# Patient Record
Sex: Male | Born: 1955 | Race: Black or African American | Hispanic: No | Marital: Married
Health system: Southern US, Community
[De-identification: ages and names within clinical notes are randomized; demographics above are authoritative.]

---

## 2021-06-17 ENCOUNTER — Emergency Department
Admission: EM | Admit: 2021-06-17 | Discharge: 2021-06-17 | Disposition: A | Discharge status: Home / Self Care | Payer: BC Managed Care – PPO | Attending: Emergency Medicine | Admitting: Emergency Medicine

## 2021-06-17 ENCOUNTER — Other Ambulatory Visit: Payer: Self-pay

## 2021-06-17 ENCOUNTER — Emergency Department: Payer: BC Managed Care – PPO

## 2021-06-17 DIAGNOSIS — R1012 Left upper quadrant pain: Secondary | ICD-10-CM | POA: Diagnosis present

## 2021-06-17 DIAGNOSIS — R109 Unspecified abdominal pain: Secondary | ICD-10-CM

## 2021-06-17 LAB — URINALYSIS, COMPLETE (UACMP) WITH MICROSCOPIC
Bacteria, UA: NONE SEEN
Bilirubin Urine: NEGATIVE
Glucose, UA: 50 mg/dL — AB
Hgb urine dipstick: NEGATIVE
Ketones, ur: NEGATIVE mg/dL
Leukocytes,Ua: NEGATIVE
Nitrite: NEGATIVE
Protein, ur: 30 mg/dL — AB
Specific Gravity, Urine: 1.021 (ref 1.005–1.030)
pH: 5 (ref 5.0–8.0)

## 2021-06-17 LAB — COMPREHENSIVE METABOLIC PANEL
ALT: 36 U/L (ref 0–44)
AST: 40 U/L (ref 15–41)
Albumin: 3.9 g/dL (ref 3.5–5.0)
Alkaline Phosphatase: 72 U/L (ref 38–126)
Anion gap: 12 (ref 5–15)
BUN: 12 mg/dL (ref 8–23)
CO2: 26 mmol/L (ref 22–32)
Calcium: 9.1 mg/dL (ref 8.9–10.3)
Chloride: 101 mmol/L (ref 98–111)
Creatinine, Ser: 1.04 mg/dL (ref 0.61–1.24)
GFR, Estimated: >60 mL/min (ref >60)
Glucose, Bld: 118 mg/dL — ABNORMAL HIGH (ref 70–99)
Potassium: 3.3 mmol/L — ABNORMAL LOW (ref 3.5–5.1)
Sodium: 139 mmol/L (ref 135–145)
Total Bilirubin: 1 mg/dL (ref 0.3–1.2)
Total Protein: 7.9 g/dL (ref 6.5–8.1)

## 2021-06-17 LAB — CBC
HCT: 38.4 % — ABNORMAL LOW (ref 39.0–52.0)
Hemoglobin: 13.2 g/dL (ref 13.0–17.0)
MCH: 28.9 pg (ref 26.0–34.0)
MCHC: 34.4 g/dL (ref 30.0–36.0)
MCV: 84.2 fL (ref 80.0–100.0)
Platelets: 233 10*3/uL (ref 150–400)
RBC: 4.56 MIL/uL (ref 4.22–5.81)
RDW: 13.7 % (ref 11.5–15.5)
WBC: 8 10*3/uL (ref 4.0–10.5)
nRBC: 0 % (ref 0.0–0.2)

## 2021-06-17 LAB — LIPASE, BLOOD: Lipase: 24 U/L (ref 11–51)

## 2021-06-17 MED ORDER — IOHEXOL 350 MG/ML SOLN
75.0000 mL | Freq: Once | INTRAVENOUS | Status: AC | PRN
  Administered 2021-06-17: 75 mL via INTRAVENOUS

## 2021-06-17 MED ORDER — IBUPROFEN 400 MG PO TABS
400.0000 mg | ORAL_TABLET | Freq: Once | ORAL | Status: AC
  Administered 2021-06-17: 400 mg via ORAL
  Filled 2021-06-17: qty 1

## 2021-06-17 NOTE — ED Provider Notes (Signed)
[facility]  ____________________________________________   Event Date/Time   First MD Initiated Contact with Patient 06/17/21 1018     (approximate)  I have reviewed the triage vital signs and the nursing notes.   HISTORY  Chief Complaint LUQ pain    HPI Vann Okerlund is a 65 y.o. male no pmh p/w L sided flank pain.  Symptom onset was 3 days ago.  Pain is located in the left flank radiating around to the left upper back.  Denies associated shortness of breath or chest pain.  No nausea or vomiting.  Denies urinary symptoms including dysuria or hematuria.  Has never had this before.  Denies any preceding injury or activity that could have exacerbated it.      History reviewed. No pertinent past medical history.  There are no problems to display for this patient.   History reviewed. No pertinent surgical history.  Prior to Admission medications   Not on File    Allergies Patient has no allergy information on record.  No family history on file.  Social History    Review of Systems   Review of Systems  Constitutional:  Negative for chills and fever.  Respiratory:  Negative for shortness of breath.   Cardiovascular:  Negative for chest pain.  Gastrointestinal:  Negative for abdominal pain, nausea and vomiting.  Genitourinary:  Negative for difficulty urinating, dysuria and hematuria.  Neurological:  Negative for numbness.  All other systems reviewed and are negative.  Physical Exam Updated Vital Signs BP (!) 165/85   Pulse 74   Temp 98 F (36.7 C)   Resp 18   SpO2 100%   Physical Exam Vitals and nursing note reviewed.  Constitutional:      General: He is not in acute distress.    Appearance: Normal appearance.  HENT:     Head: Normocephalic and atraumatic.  Eyes:     General: No scleral icterus.    Conjunctiva/sclera: Conjunctivae normal.  Pulmonary:     Effort: Pulmonary effort is normal. No respiratory distress.      Breath sounds: Normal breath sounds. No wheezing.  Abdominal:     Palpations: Abdomen is soft.     Tenderness: There is no abdominal tenderness. There is left CVA tenderness.  Musculoskeletal:        General: No deformity or signs of injury.     Cervical back: Normal range of motion.  Skin:    Coloration: Skin is not jaundiced or pale.  Neurological:     General: No focal deficit present.     Mental Status: He is alert and oriented to person, place, and time. Mental status is at baseline.  Psychiatric:        Mood and Affect: Mood normal.        Behavior: Behavior normal.     LABS (all labs ordered are listed, but only abnormal results are displayed)  Labs Reviewed  COMPREHENSIVE METABOLIC PANEL - Abnormal; Notable for the following components:      Result Value   Potassium 3.3 (*)    Glucose, Bld 118 (*)    All other components within normal limits  CBC - Abnormal; Notable for the following components:   HCT 38.4 (*)    All other components within normal limits  URINALYSIS, COMPLETE (UACMP) WITH MICROSCOPIC - Abnormal; Notable for the following components:   Color, Urine YELLOW (*)    APPearance CLEAR (*)    Glucose, UA 50 (*)    Protein,  ur 30 (*)    All other components within normal limits  LIPASE, BLOOD   ____________________________________________  EKG  N/a ____________________________________________  RADIOLOGY I, Randol Kern, personally viewed and evaluated these images (plain radiographs) as part of my medical decision making, as well as reviewing the written report by the radiologist.  ED MD interpretation: CT abdomen pelvis does not show any acute abdominal pathology    ____________________________________________   PROCEDURES  Procedure(s) performed (including Critical Care):  Procedures   ____________________________________________   INITIAL IMPRESSION / ASSESSMENT AND PLAN / ED COURSE     Patient is a 65 year old male who  presents with left-sided flank pain for 3 days.  He has no abdominal tenderness on exam but does have some CVA tenderness without associated symptoms.  Obtained a chest x-ray to evaluate for pneumonia or other pulmonary etiology of the pain which is negative.  Labs are reassuring.  UA without evidence of infection or hematuria.  I considered pulmonary embolism however he has no tachycardia and is satting 100% without any shortness of breath or chest pain I feel this is less likely.  Given this is new pain for him obtained a CT abdomen pelvis with contrast which is essentially normal.  Suspect that this is just musculoskeletal in nature.  Advised the patient take NSAIDs and follow-up with his primary care provider.      ____________________________________________   FINAL CLINICAL IMPRESSION(S) / ED DIAGNOSES  Final diagnoses:  Flank pain     ED Discharge Orders     None        Note:  This document was prepared using Dragon voice recognition software and may include unintentional dictation errors.    Georga Hacking, MD 06/17/21 [phone]

## 2021-06-17 NOTE — ED Triage Notes (Signed)
Pt comes with c/o LUQ pain for few days. Pt denies any fall or injuries. Pt states he just couldn't sleep last night it was hurting so bad.

## 2022-05-31 IMAGING — CR DG CHEST 2V
1 series · 2 of 2 positions shown · non-contrast
Comparison: None.

CLINICAL DATA: 64-year-old male with left upper quadrant pain. No
known injury.

EXAM:
CHEST - 2 VIEW

[Series 1: view not recorded · 0.14mm/px · 2 of 2 slices shown]
[im 1/2]
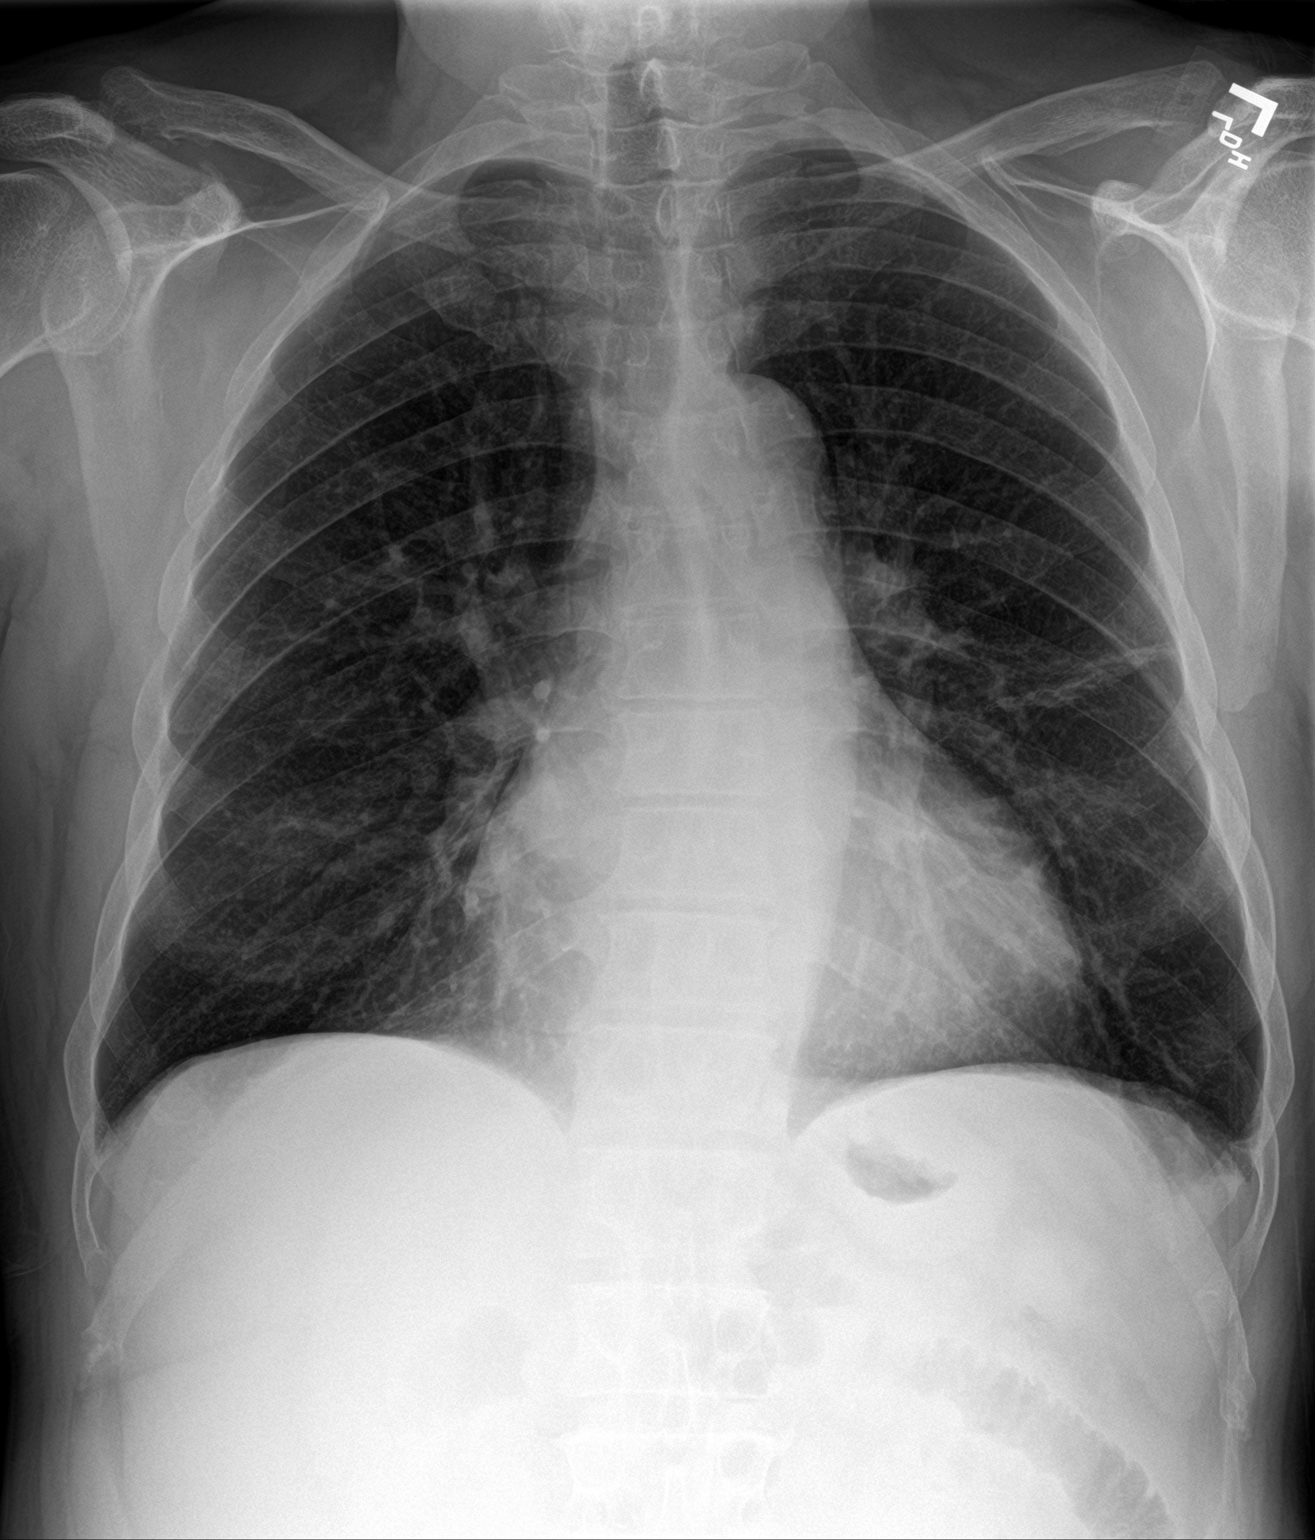
[im 2/2]
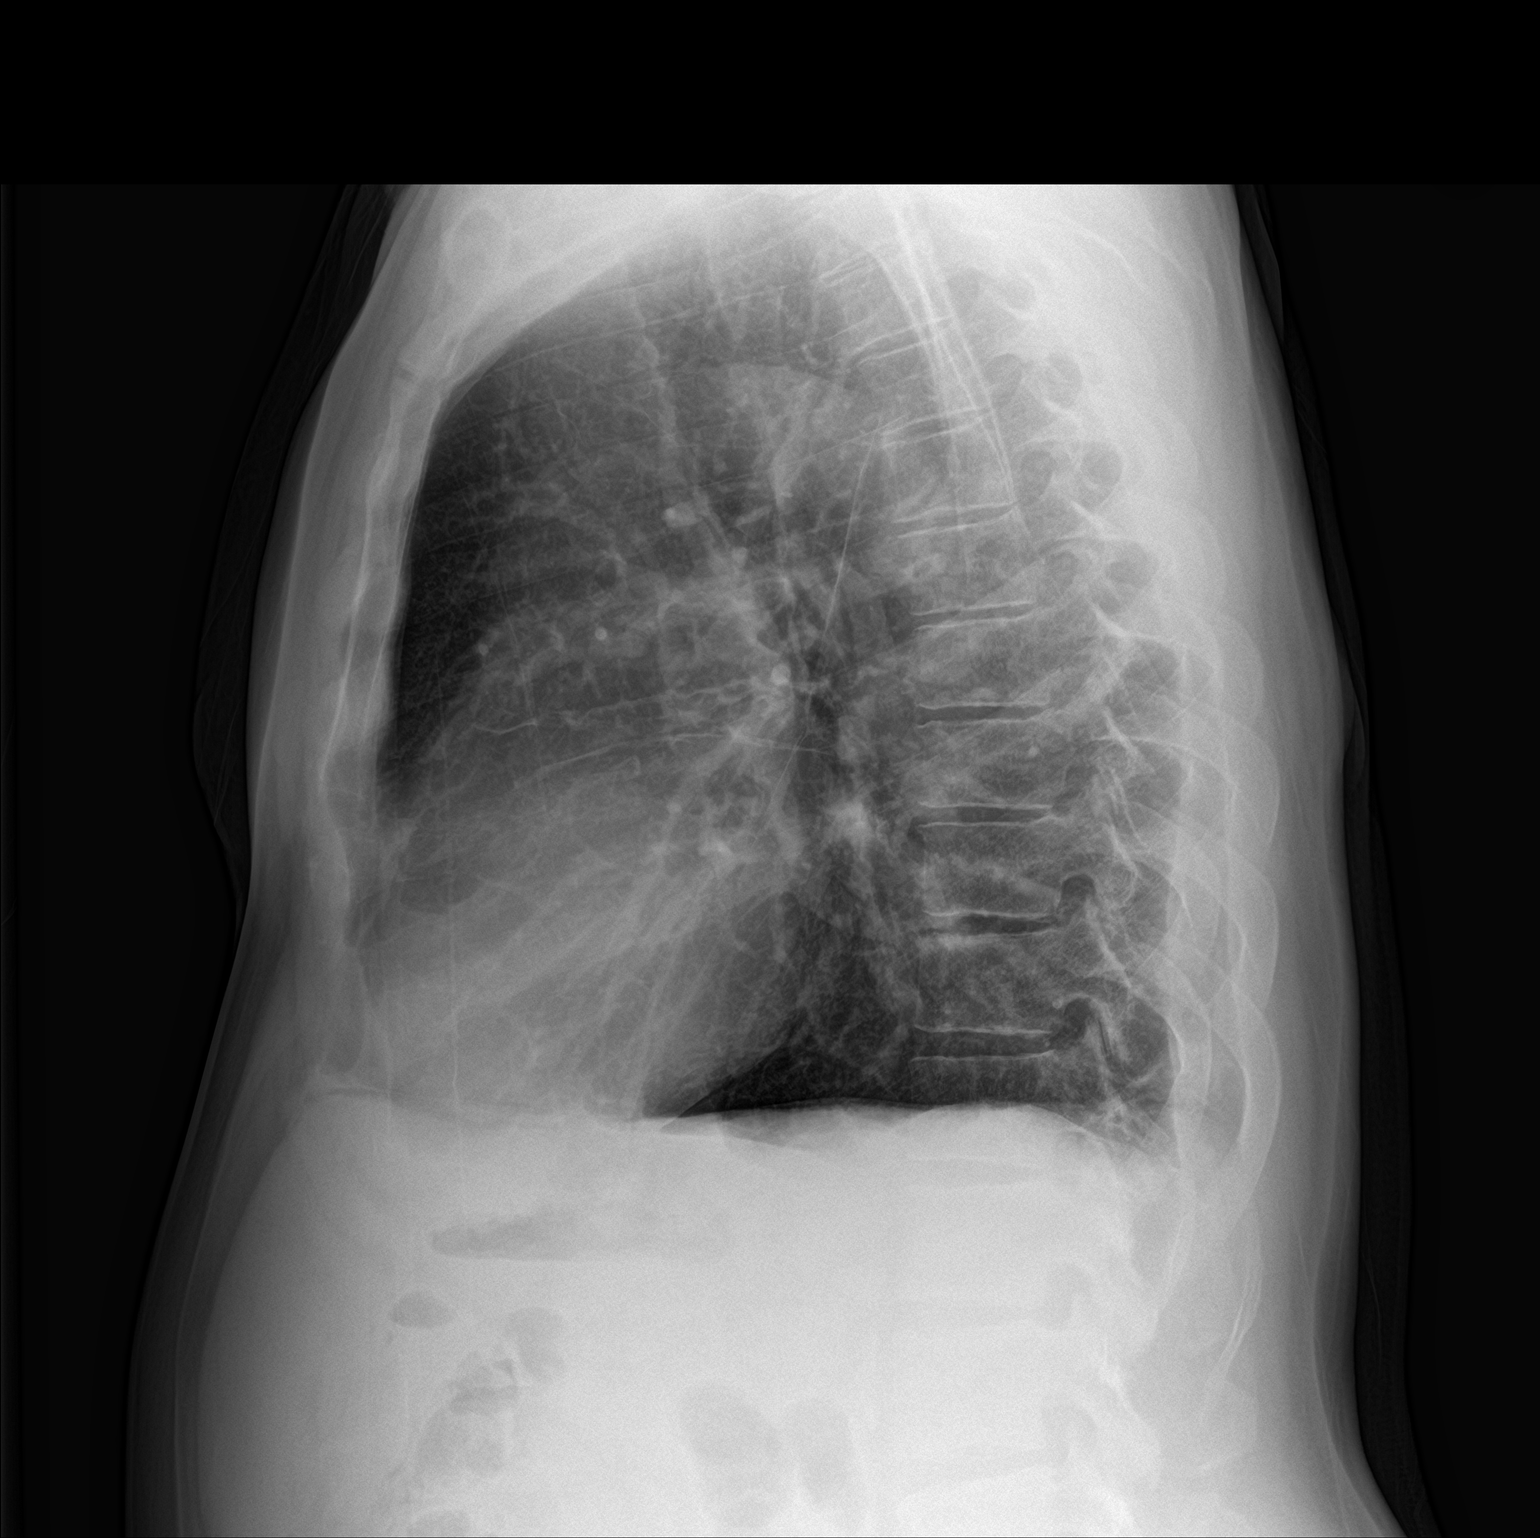

[2 of 2 positions shown; findings below may reference images not displayed]

FINDINGS: Cardiac size and lung volumes are at the upper limits of normal.
Other mediastinal contours are within normal limits. Visualized
tracheal air column is within normal limits. There is linear
platelike atelectasis or scarring in the left mid lung. No
pneumothorax or pulmonary edema. No pleural effusion or other
confluent pulmonary opacity.

No acute osseous abnormality identified. Visible bowel gas pattern
is within normal limits.
IMPRESSION: No acute cardiopulmonary abnormality; linear atelectasis or scarring
in the left mid lung.
